# Patient Record
Sex: Male | Born: 1980 | Race: White | Hispanic: No | Smoking: Current every day smoker
Health system: Southern US, Community
[De-identification: ages and names within clinical notes are randomized; demographics above are authoritative.]

## PROBLEM LIST (undated history)

## (undated) DIAGNOSIS — I10 Essential (primary) hypertension: Secondary | ICD-10-CM

## (undated) DIAGNOSIS — K759 Inflammatory liver disease, unspecified: Secondary | ICD-10-CM

---

## 2018-10-26 DIAGNOSIS — F332 Major depressive disorder, recurrent severe without psychotic features: Secondary | ICD-10-CM | POA: Insufficient documentation

## 2018-10-26 DIAGNOSIS — Z20828 Contact with and (suspected) exposure to other viral communicable diseases: Secondary | ICD-10-CM | POA: Insufficient documentation

## 2018-10-26 DIAGNOSIS — R45851 Suicidal ideations: Secondary | ICD-10-CM | POA: Diagnosis not present

## 2018-10-27 ENCOUNTER — Encounter (HOSPITAL_COMMUNITY): Payer: Self-pay

## 2018-10-27 ENCOUNTER — Ambulatory Visit (HOSPITAL_COMMUNITY): Payer: Self-pay

## 2018-10-27 ENCOUNTER — Observation Stay (HOSPITAL_COMMUNITY)
Admission: AD | Admit: 2018-10-27 | Discharge: 2018-10-28 | Disposition: A | Discharge status: Home / Self Care | Payer: No Typology Code available for payment source | Source: Intra-hospital | Attending: Psychiatry | Admitting: Psychiatry

## 2018-10-27 ENCOUNTER — Other Ambulatory Visit: Payer: Self-pay

## 2018-10-27 ENCOUNTER — Emergency Department (HOSPITAL_COMMUNITY)
Admission: EM | Admit: 2018-10-27 | Discharge: 2018-10-27 | Disposition: A | Discharge status: Other Acute Inpatient Hospital | Payer: No Typology Code available for payment source | Attending: Emergency Medicine | Admitting: Emergency Medicine

## 2018-10-27 ENCOUNTER — Encounter (HOSPITAL_COMMUNITY): Payer: Self-pay | Admitting: *Deleted

## 2018-10-27 DIAGNOSIS — F4321 Adjustment disorder with depressed mood: Secondary | ICD-10-CM | POA: Diagnosis not present

## 2018-10-27 DIAGNOSIS — R45851 Suicidal ideations: Secondary | ICD-10-CM | POA: Diagnosis present

## 2018-10-27 DIAGNOSIS — Z79899 Other long term (current) drug therapy: Secondary | ICD-10-CM | POA: Diagnosis not present

## 2018-10-27 DIAGNOSIS — F332 Major depressive disorder, recurrent severe without psychotic features: Secondary | ICD-10-CM | POA: Diagnosis present

## 2018-10-27 DIAGNOSIS — F1114 Opioid abuse with opioid-induced mood disorder: Secondary | ICD-10-CM

## 2018-10-27 DIAGNOSIS — F319 Bipolar disorder, unspecified: Secondary | ICD-10-CM | POA: Insufficient documentation

## 2018-10-27 DIAGNOSIS — Z20828 Contact with and (suspected) exposure to other viral communicable diseases: Secondary | ICD-10-CM | POA: Insufficient documentation

## 2018-10-27 DIAGNOSIS — I10 Essential (primary) hypertension: Secondary | ICD-10-CM | POA: Insufficient documentation

## 2018-10-27 DIAGNOSIS — Z791 Long term (current) use of non-steroidal anti-inflammatories (NSAID): Secondary | ICD-10-CM | POA: Diagnosis not present

## 2018-10-27 DIAGNOSIS — F1119 Opioid abuse with unspecified opioid-induced disorder: Secondary | ICD-10-CM | POA: Diagnosis not present

## 2018-10-27 HISTORY — DX: Inflammatory liver disease, unspecified: K75.9

## 2018-10-27 HISTORY — DX: Essential (primary) hypertension: I10

## 2018-10-27 LAB — CBC
HCT: 45.8 % (ref 39.0–52.0)
Hemoglobin: 15.5 g/dL (ref 13.0–17.0)
MCH: 30.6 pg (ref 26.0–34.0)
MCHC: 33.8 g/dL (ref 30.0–36.0)
MCV: 90.3 fL (ref 80.0–100.0)
Platelets: 228 10*3/uL (ref 150–400)
RBC: 5.07 MIL/uL (ref 4.22–5.81)
RDW: 12.6 % (ref 11.5–15.5)
WBC: 6.9 10*3/uL (ref 4.0–10.5)
nRBC: 0 % (ref 0.0–0.2)

## 2018-10-27 LAB — SARS CORONAVIRUS 2 BY RT PCR (HOSPITAL ORDER, PERFORMED IN ~~LOC~~ HOSPITAL LAB): SARS Coronavirus 2: NEGATIVE

## 2018-10-27 LAB — COMPREHENSIVE METABOLIC PANEL
ALT: 25 U/L (ref 0–44)
AST: 21 U/L (ref 15–41)
Albumin: 4.3 g/dL (ref 3.5–5.0)
Alkaline Phosphatase: 49 U/L (ref 38–126)
Anion gap: 10 (ref 5–15)
BUN: 15 mg/dL (ref 6–20)
CO2: 23 mmol/L (ref 22–32)
Calcium: 9.4 mg/dL (ref 8.9–10.3)
Chloride: 102 mmol/L (ref 98–111)
Creatinine, Ser: 1.01 mg/dL (ref 0.61–1.24)
GFR calc Af Amer: >60 mL/min (ref >60)
GFR calc non Af Amer: >60 mL/min (ref >60)
Glucose, Bld: 103 mg/dL — ABNORMAL HIGH (ref 70–99)
Potassium: 3.9 mmol/L (ref 3.5–5.1)
Sodium: 135 mmol/L (ref 135–145)
Total Bilirubin: 0.9 mg/dL (ref 0.3–1.2)
Total Protein: 7.6 g/dL (ref 6.5–8.1)

## 2018-10-27 LAB — RAPID URINE DRUG SCREEN, HOSP PERFORMED
Amphetamines: NOT DETECTED
Barbiturates: NOT DETECTED
Benzodiazepines: NOT DETECTED
Cocaine: NOT DETECTED
Opiates: NOT DETECTED
Tetrahydrocannabinol: NOT DETECTED

## 2018-10-27 LAB — ETHANOL: Alcohol, Ethyl (B): <10 mg/dL (ref <10)

## 2018-10-27 LAB — ACETAMINOPHEN LEVEL: Acetaminophen (Tylenol), Serum: <10 ug/mL — ABNORMAL LOW (ref 10–30)

## 2018-10-27 LAB — SALICYLATE LEVEL: Salicylate Lvl: <7 mg/dL (ref 2.8–30.0)

## 2018-10-27 MED ORDER — QUETIAPINE FUMARATE 300 MG PO TABS: 300.0000 mg | ORAL_TABLET | Freq: Every day | ORAL | Status: DC

## 2018-10-27 MED ORDER — MAGNESIUM HYDROXIDE 400 MG/5ML PO SUSP: 30.0000 mL | Freq: Every day | ORAL | Status: DC | PRN

## 2018-10-27 MED ORDER — LORAZEPAM 1 MG PO TABS
1.0000 mg | ORAL_TABLET | ORAL | Status: DC | PRN
  Administered 2018-10-27: 1 mg via ORAL
  Filled 2018-10-27: qty 1

## 2018-10-27 MED ORDER — HYDROXYZINE HCL 25 MG PO TABS
25.0000 mg | ORAL_TABLET | Freq: Three times a day (TID) | ORAL | Status: DC | PRN
  Administered 2018-10-27: 25 mg via ORAL
  Filled 2018-10-27: qty 1

## 2018-10-27 MED ORDER — FLUOXETINE HCL 10 MG PO CAPS
10.0000 mg | ORAL_CAPSULE | Freq: Every day | ORAL | Status: DC
  Administered 2018-10-27 – 2018-10-28 (×2): 10 mg via ORAL
  Filled 2018-10-27 (×2): qty 1

## 2018-10-27 MED ORDER — FAMOTIDINE 10 MG PO TABS
20.0000 mg | ORAL_TABLET | Freq: Once | ORAL | Status: AC
  Administered 2018-10-27: 22:00:00 20 mg via ORAL
  Filled 2018-10-27: qty 2

## 2018-10-27 MED ORDER — NICOTINE 21 MG/24HR TD PT24
21.0000 mg | MEDICATED_PATCH | Freq: Every day | TRANSDERMAL | Status: DC
  Administered 2018-10-27: 21 mg via TRANSDERMAL
  Filled 2018-10-27 (×2): qty 1

## 2018-10-27 MED ORDER — ACETAMINOPHEN 325 MG PO TABS: 650.0000 mg | ORAL_TABLET | Freq: Four times a day (QID) | ORAL | Status: DC | PRN

## 2018-10-27 MED ORDER — QUETIAPINE FUMARATE 300 MG PO TABS
300.0000 mg | ORAL_TABLET | Freq: Every day | ORAL | Status: DC
  Administered 2018-10-27: 300 mg via ORAL
  Filled 2018-10-27: qty 1

## 2018-10-27 MED ORDER — ALUM & MAG HYDROXIDE-SIMETH 200-200-20 MG/5ML PO SUSP
30.0000 mL | ORAL | Status: DC | PRN
  Administered 2018-10-27: 30 mL via ORAL
  Filled 2018-10-27: qty 30

## 2018-10-27 MED ORDER — QUETIAPINE FUMARATE 200 MG PO TABS: 200.0000 mg | ORAL_TABLET | Freq: Every day | ORAL | Status: DC

## 2018-10-27 NOTE — ED Triage Notes (Signed)
Pt reports depression and SI. He states that the last time he was at the hospital they gave him seroquel for sleep, but nothing for depression. He reports that he has a plan to kill himself, but did not divulge. A&Ox4. Tearful in triage.
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
## 2018-10-27 NOTE — Plan of Care (Signed)
   Reason for Crisis Plan:  Crisis Stabilization   Plan of Care:  Referral for Telepsychiatry/Psychiatric Consult  Family Support:      Current Living Environment:  Living Arrangements: Alone  Insurance:   Hospital Account    Name Acct ID Class Status Primary Coverage   Jonathandavid, [place] [id number] [place] [employer] MEDICAID - [facility]        Guarantor Account (for Hospital Account [id number])    Name Relation to Pt Service Area Active? Acct Type   Wyn Quaker Self [facility] Yes Behavioral Health   Address Phone       [address] [place], [place] [address] [phone](H)          Coverage Information (for Hospital Account [id number])    F/O Payor/Plan Precert #   [facility] MEDICAID/[facility]    Subscriber Subscriber #   [place], [place] [id number]   Address Phone   [place], [place] [address] [phone]      Legal Guardian:     Primary Care Provider:  Patient, No Pcp Per  Current Outpatient Providers:   Psychiatrist:     Counselor/Therapist:     Compliant with Medications:   Additional Information:   Mosie Lukes 9/5/20203:03 PM

## 2018-10-27 NOTE — ED Provider Notes (Signed)
[facility] DEPT Provider Note   CSN: [id number] Arrival date & time: 10/26/18  2341     History   Chief Complaint Chief Complaint  Patient presents with  . Suicidal    HPI Robert Bass is a 38 y.o. male.     The history is provided by the patient.  Mental Health Problem Presenting symptoms: suicidal thoughts   Degree of incapacity (severity):  Moderate Onset quality:  Sudden Timing:  Constant Progression:  Worsening Chronicity:  Recurrent Relieved by:  Nothing Worsened by:  Nothing Associated symptoms: no abdominal pain, no chest pain and no headaches   Patient presents for suicidal ideation.  He reports recent admission for depression, but reports he is not on meds anymore.  He reports having thoughts of harming self.  Denies any attempt.  He has no other complaints   PMH-Depression Soc hx - unknown Home Medications    Prior to Admission medications   Not on File    Family History History reviewed. No pertinent family history.  Social History Social History   Tobacco Use  . Smoking status: Not on file  Substance Use Topics  . Alcohol use: Not on file  . Drug use: Not on file     Allergies   Patient has no known allergies.   Review of Systems Review of Systems  Constitutional: Negative for fever.  Cardiovascular: Negative for chest pain.  Gastrointestinal: Negative for abdominal pain.  Neurological: Negative for headaches.  Psychiatric/Behavioral: Positive for suicidal ideas.  All other systems reviewed and are negative.    Physical Exam Updated Vital Signs BP 139/86 (BP Location: Left Arm)   Pulse 88   Temp 98 F (36.7 C) (Oral)   Resp 16   SpO2 99%   Physical Exam CONSTITUTIONAL: Disheveled, no acute distress HEAD: Normocephalic/atraumatic EYES: EOMI ENMT: Mucous membranes moist NECK: supple no meningeal signs SPINE/BACK:entire spine nontender CV: S1/S2 noted, no murmurs/rubs/gallops noted LUNGS:  Lungs are clear to auscultation bilaterally, no apparent distress ABDOMEN: soft, nontender NEURO: Pt is awake/alert/appropriate, moves all extremitiesx4.  No facial droop.   EXTREMITIES: pulses normal/equal, full ROM SKIN: warm, color normal PSYCH: no abnormalities of mood noted, alert and oriented to situation   ED Treatments / Results  Labs (all labs ordered are listed, but only abnormal results are displayed) Labs Reviewed  COMPREHENSIVE METABOLIC PANEL - Abnormal; Notable for the following components:      Result Value   Glucose, Bld 103 (*)    All other components within normal limits  ACETAMINOPHEN LEVEL - Abnormal; Notable for the following components:   Acetaminophen (Tylenol), Serum <10 (*)    All other components within normal limits  SARS CORONAVIRUS 2 (HOSPITAL ORDER, [facility])  ETHANOL  SALICYLATE LEVEL  CBC  RAPID URINE DRUG SCREEN, HOSP PERFORMED    EKG None  Radiology No results found.  Procedures Procedures   Medications Ordered in ED Medications  LORazepam (ATIVAN) tablet 1 mg (1 mg Oral Given 10/27/18 0240)  QUEtiapine (SEROQUEL) tablet 300 mg (has no administration in time range)     Initial Impression / Assessment and Plan / ED Course  I have reviewed the triage vital signs and the nursing notes.  Pertinent labs  results that were available during my care of the patient were reviewed by me and considered in my medical decision making (see chart for details).        1:47 AM Patient is medically stable and appropriate for  psychiatric disposition  Final Clinical Impressions(s) / ED Diagnoses   Final diagnoses:  Suicidal ideation    ED Discharge Orders    None       Keric Zehren, DoZadie Rhinenald, MD 10/27/18 [phone]

## 2018-10-27 NOTE — Progress Notes (Signed)
Patient ID: Robert Bass, male   DOB: 26-Jun-1980, 38 y.o.   MRN: [id number] Pt A&O x 4, no distress noted, calm & cooperative, pt taking a shower at present.  Monitoring for safety.
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
## 2018-10-27 NOTE — ED Notes (Signed)
Provider at bedside

## 2018-10-27 NOTE — Patient Outreach (Signed)
CPSS met with the patient on the [facility] observation unit in order to provide substance use recovery support and help with getting connected to substance use treatment resources. Patient states that he has not actively used any drugs in about 2 weeks. Patient reports a past history of heroin and methamphetamine use. Patient is interested in dual diagnosis resources specifically for the [place] area. CPSS provided information for several different outpatient treatment centers that provided dual diagnosis treatment in the [place] area. CPSS also provided an [facility] NA meeting list and CPSS contact information. CPSS strongly encouraged the patient to contact CPSS if needed for further help with getting connected to substance use treatment resources.

## 2018-10-27 NOTE — BH Assessment (Signed)
Tele Assessment Note   Patient Name: Robert [facility] MRN: [id number] Referring Physician: Zadie Rhine, MD Location of Patient: [facility], [phone] Location of Provider: Behavioral Health TTS Department  [name] is an 38 y.o. single male who presents unaccompanied to [facility] reporting depressive symptoms including suicidal ideation. Pt is a poor historian because he is very sleepy and appears groggy. Pt reports he has a history of depression and has felt severely depressed for the past week. He says he is very upset because his in-laws have custody of his 47 year old son and will not let Pt see him. He reports having thoughts of hurting in-laws with no plan or intent. Pt reports current suicidal ideation with plan to overdose on pills. He denies previous suicide attempts. He reports his father died by suicide. Pt acknowledges symptoms including crying spells, social withdrawal, loss of interest in usual pleasures, fatigue, irritability, decreased concentration, decreased sleep and feelings of worthlessness and hopelessness. He denies intentional self-injurious behavior. He denies history of aggressive behavior. He denies any history of auditory or visual hallucinations. Pt report he has a history of using drugs, including methamphetamines, but denies recent use. Pt's urine drugs and alcohol screens are negative.  Pt identifies inability to see son as his primary stressor. He says he lives alone. He reports a history of being convicted of manufacturing methamphetamines. He cannot identify and social supports. Pt says he was psychiatrically hospitalized approximately one month ago at a facility in [place], [place]. He says he was discharged with Seroquel for sleep but no antidepressants.   Pt is dressed in hospital scrubs bottoms and no shirt. He is very drowsy and oriented to person, place and situation. Pt speaks in a mumbled tone, at low volume and normal pace. Motor behavior appears normal.  Eye contact is minimal. Pt's mood is depressed and affect is congruent with mood. Thought process is coherent and relevant. There is no indication Pt is currently responding to internal stimuli or experiencing delusional thought content. Pt was cooperative throughout assessment. He says he is willing to sign voluntarily into a psychiatric facility.   Diagnosis: F33.2 Major depressive disorder, Recurrent episode, Severe  Past Medical History: History reviewed. No pertinent past medical history.    Family History: History reviewed. No pertinent family history.  Social History:  has no history on file for tobacco, alcohol, and drug.  Additional Social History:  Alcohol / Drug Use Pain Medications: Denies abuse Prescriptions: Denies abuse Over the Counter: Denies abuse History of alcohol / drug use?: Yes(Pt reports history of substance use. Denies recent use.) Longest period of sobriety (when/how long): Unknown  CIWA: CIWA-Ar BP: 139/86 Pulse Rate: 88 COWS:    Allergies: No Known Allergies  Home Medications: (Not in a hospital admission)   OB/GYN Status:  No LMP for male patient.  General Assessment Data Location of Assessment: [facility] TTS Assessment: In system Is this a Tele or Face-to-Face Assessment?: Tele Assessment Is this an Initial Assessment or a Re-assessment for this encounter?: Initial Assessment Patient Accompanied by:: N/A Language Other than English: No Living Arrangements: Other (Comment)(Lives alone) What gender do you identify as?: Male Marital status: Single Maiden name: NA Pregnancy Status: No Living Arrangements: Alone Can pt return to current living arrangement?: Yes Admission Status: Voluntary Is patient capable of signing voluntary admission?: Yes Referral Source: Self/Family/Friend Insurance type: Medicaid     Crisis Care Plan Living Arrangements: Alone Legal Guardian: Other:(Self) Name of Psychiatrist: None Name of Therapist:  None  Education Status Is patient currently in school?: No Is the patient employed, unemployed or receiving disability?: Unemployed  Risk to self with the past 6 months Suicidal Ideation: Yes-Currently Present Has patient been a risk to self within the past 6 months prior to admission? : Yes Suicidal Intent: Yes-Currently Present Has patient had any suicidal intent within the past 6 months prior to admission? : Yes Is patient at risk for suicide?: Yes Suicidal Plan?: Yes-Currently Present Has patient had any suicidal plan within the past 6 months prior to admission? : Yes Specify Current Suicidal Plan: Overdose on pills Access to Means: Yes Specify Access to Suicidal Means: Access to medications What has been your use of drugs/alcohol within the last 12 months?: Pt reports a history of substance use. Denies recent use Previous Attempts/Gestures: No How many times?: 0 Other Self Harm Risks: None Triggers for Past Attempts: None known Intentional Self Injurious Behavior: None Family Suicide History: Yes(Father died by suicide) Recent stressful life event(s): Legal Issues, Other (Comment)(Inlaws have custody of Pt's son) Persecutory voices/beliefs?: No Depression: Yes Depression Symptoms: Despondent, Tearfulness, Isolating, Feeling angry/irritable, Feeling worthless/self pity, Loss of interest in usual pleasures, Fatigue, Guilt Substance abuse history and/or treatment for substance abuse?: Yes Suicide prevention information given to non-admitted patients: Not applicable  Risk to Others within the past 6 months Homicidal Ideation: Yes-Currently Present Does patient have any lifetime risk of violence toward others beyond the six months prior to admission? : No Thoughts of Harm to Others: Yes-Currently Present Comment - Thoughts of Harm to Others: Thoughts of harming inlaws Current Homicidal Intent: No Current Homicidal Plan: No Access to Homicidal Means: No Identified Victim:  Inlaws History of harm to others?: No Assessment of Violence: None Noted Violent Behavior Description: Pt denies history of violence Does patient have access to weapons?: No Criminal Charges Pending?: No Does patient have a court date: No Court Date: (Unknown) Is patient on probation?: Unknown  Psychosis Hallucinations: None noted Delusions: None noted  Mental Status Report Appearance/Hygiene: In scrubs Eye Contact: Poor Motor Activity: Unremarkable, Freedom of movement Speech: Soft Level of Consciousness: Drowsy Mood: Depressed Affect: Depressed Anxiety Level: None Thought Processes: Coherent, Relevant Judgement: Partial Orientation: Person, Place, Time, Situation Obsessive Compulsive Thoughts/Behaviors: None  Cognitive Functioning Concentration: Decreased Memory: Recent Intact, Remote Intact Is patient IDD: No Insight: Fair Impulse Control: Fair Appetite: Good Have you had any weight changes? : No Change Sleep: Decreased Total Hours of Sleep: 3 Vegetative Symptoms: None  ADLScreening [facility] Assessment Services) Patient's cognitive ability adequate to safely complete daily activities?: Yes Patient able to express need for assistance with ADLs?: Yes Independently performs ADLs?: Yes (appropriate for developmental age)  Prior Inpatient Therapy Prior Inpatient Therapy: Yes Prior Therapy Dates: 09/2018 Prior Therapy Facilty/Provider(s): [facility] Reason for Treatment: Depression  Prior Outpatient Therapy Prior Outpatient Therapy: No Does patient have an ACCT team?: No Does patient have Intensive In-House Services?  : No Does patient have [facility] services? : No Does patient have P4CC services?: No  ADL Screening (condition at time of admission) Patient's cognitive ability adequate to safely complete daily activities?: Yes Is the patient deaf or have difficulty hearing?: No Does the patient have difficulty seeing, even when wearing glasses/contacts?:  No Does the patient have difficulty concentrating, remembering, or making decisions?: No Patient able to express need for assistance with ADLs?: Yes Does the patient have difficulty dressing or bathing?: No Independently performs ADLs?: Yes (appropriate for developmental age) Does the patient have difficulty walking or climbing stairs?:  No Weakness of Legs: None Weakness of Arms/Hands: None  Home Assistive Devices/Equipment Home Assistive Devices/Equipment: None    Abuse/Neglect Assessment (Assessment to be complete while patient is alone) Abuse/Neglect Assessment Can Be Completed: Yes Physical Abuse: Denies Verbal Abuse: Denies Sexual Abuse: Denies Exploitation of patient/patient's resources: Denies Self-Neglect: Denies     Merchant navy officerAdvance Directives (For Healthcare) Does Patient Have a Medical Advance Directive?: No Would patient like information on creating a medical advance directive?: No - Patient declined          Disposition: Gave clinical report to Nira ConnJason Berry, FNP who recommended Pt be admitted to Observation Unit for further evaluation. Binnie RailJoAnn Glover, [facility] confirmed bed [facility]. Pt accepted to room 206-1 pending resulted COVID test. Notified Dr. Zadie Rhineonald Wickline and Clide CliffJacob Talkington, RN of acceptance.  Disposition Initial Assessment Completed for this Encounter: Yes  This service was provided via telemedicine using a 2-way, interactive audio and video technology.  Names of all persons participating in this telemedicine service and their role in this encounter. Name: Donnella ShamGary Skaggs Role: Patient  Name: Shela CommonsFord Kymberly Blomberg Jr, [facility] Role: TTS counselor         Harlin RainFord Ellis Patsy BaltimoreWarrick Jr, [facility], [facility], [facility] Triage Specialist [phone]  Pamalee LeydenWarrick Jr, Minard Millirons Ellis 10/27/2018 5:55 AM

## 2018-10-28 ENCOUNTER — Encounter (HOSPITAL_COMMUNITY): Payer: Self-pay

## 2018-10-28 ENCOUNTER — Emergency Department (HOSPITAL_COMMUNITY)
Admission: EM | Admit: 2018-10-28 | Discharge: 2018-10-30 | Payer: No Typology Code available for payment source | Attending: Emergency Medicine | Admitting: Emergency Medicine

## 2018-10-28 ENCOUNTER — Other Ambulatory Visit: Payer: Self-pay

## 2018-10-28 DIAGNOSIS — R45851 Suicidal ideations: Secondary | ICD-10-CM | POA: Diagnosis not present

## 2018-10-28 DIAGNOSIS — T1491XA Suicide attempt, initial encounter: Secondary | ICD-10-CM | POA: Diagnosis present

## 2018-10-28 DIAGNOSIS — F17210 Nicotine dependence, cigarettes, uncomplicated: Secondary | ICD-10-CM | POA: Insufficient documentation

## 2018-10-28 DIAGNOSIS — F332 Major depressive disorder, recurrent severe without psychotic features: Secondary | ICD-10-CM | POA: Insufficient documentation

## 2018-10-28 DIAGNOSIS — R4702 Dysphasia: Secondary | ICD-10-CM | POA: Diagnosis not present

## 2018-10-28 DIAGNOSIS — F1119 Opioid abuse with unspecified opioid-induced disorder: Secondary | ICD-10-CM | POA: Diagnosis not present

## 2018-10-28 DIAGNOSIS — T43592A Poisoning by other antipsychotics and neuroleptics, intentional self-harm, initial encounter: Secondary | ICD-10-CM | POA: Diagnosis not present

## 2018-10-28 DIAGNOSIS — F1114 Opioid abuse with opioid-induced mood disorder: Secondary | ICD-10-CM | POA: Diagnosis present

## 2018-10-28 DIAGNOSIS — Z20828 Contact with and (suspected) exposure to other viral communicable diseases: Secondary | ICD-10-CM | POA: Insufficient documentation

## 2018-10-28 LAB — BASIC METABOLIC PANEL
Anion gap: 12 (ref 5–15)
BUN: 13 mg/dL (ref 6–20)
CO2: 24 mmol/L (ref 22–32)
Calcium: 9.2 mg/dL (ref 8.9–10.3)
Chloride: 103 mmol/L (ref 98–111)
Creatinine, Ser: 1 mg/dL (ref 0.61–1.24)
GFR calc Af Amer: >60 mL/min (ref >60)
GFR calc non Af Amer: >60 mL/min (ref >60)
Glucose, Bld: 117 mg/dL — ABNORMAL HIGH (ref 70–99)
Potassium: 3.4 mmol/L — ABNORMAL LOW (ref 3.5–5.1)
Sodium: 139 mmol/L (ref 135–145)

## 2018-10-28 LAB — COMPREHENSIVE METABOLIC PANEL
ALT: 27 U/L (ref 0–44)
AST: 25 U/L (ref 15–41)
Albumin: 4.8 g/dL (ref 3.5–5.0)
Alkaline Phosphatase: 49 U/L (ref 38–126)
Anion gap: 10 (ref 5–15)
BUN: 13 mg/dL (ref 6–20)
CO2: 28 mmol/L (ref 22–32)
Calcium: 9.8 mg/dL (ref 8.9–10.3)
Chloride: 103 mmol/L (ref 98–111)
Creatinine, Ser: 1.08 mg/dL (ref 0.61–1.24)
GFR calc Af Amer: >60 mL/min (ref >60)
GFR calc non Af Amer: >60 mL/min (ref >60)
Glucose, Bld: 96 mg/dL (ref 70–99)
Potassium: 3.8 mmol/L (ref 3.5–5.1)
Sodium: 141 mmol/L (ref 135–145)
Total Bilirubin: 0.9 mg/dL (ref 0.3–1.2)
Total Protein: 8.1 g/dL (ref 6.5–8.1)

## 2018-10-28 LAB — CBC WITH DIFFERENTIAL/PLATELET
Abs Immature Granulocytes: 0.03 10*3/uL (ref 0.00–0.07)
Basophils Absolute: 0 10*3/uL (ref 0.0–0.1)
Basophils Relative: 1 %
Eosinophils Absolute: 0 10*3/uL (ref 0.0–0.5)
Eosinophils Relative: 0 %
HCT: 42 % (ref 39.0–52.0)
Hemoglobin: 14.4 g/dL (ref 13.0–17.0)
Immature Granulocytes: 0 %
Lymphocytes Relative: 15 %
Lymphs Abs: 1.3 10*3/uL (ref 0.7–4.0)
MCH: 30.7 pg (ref 26.0–34.0)
MCHC: 34.3 g/dL (ref 30.0–36.0)
MCV: 89.6 fL (ref 80.0–100.0)
Monocytes Absolute: 0.7 10*3/uL (ref 0.1–1.0)
Monocytes Relative: 8 %
Neutro Abs: 6.6 10*3/uL (ref 1.7–7.7)
Neutrophils Relative %: 76 %
Platelets: 195 10*3/uL (ref 150–400)
RBC: 4.69 MIL/uL (ref 4.22–5.81)
RDW: 12.5 % (ref 11.5–15.5)
WBC: 8.7 10*3/uL (ref 4.0–10.5)
nRBC: 0 % (ref 0.0–0.2)

## 2018-10-28 LAB — CBC
HCT: 47.1 % (ref 39.0–52.0)
Hemoglobin: 15.6 g/dL (ref 13.0–17.0)
MCH: 30.1 pg (ref 26.0–34.0)
MCHC: 33.1 g/dL (ref 30.0–36.0)
MCV: 90.8 fL (ref 80.0–100.0)
Platelets: 226 10*3/uL (ref 150–400)
RBC: 5.19 MIL/uL (ref 4.22–5.81)
RDW: 12.4 % (ref 11.5–15.5)
WBC: 10.4 10*3/uL (ref 4.0–10.5)
nRBC: 0 % (ref 0.0–0.2)

## 2018-10-28 LAB — RAPID URINE DRUG SCREEN, HOSP PERFORMED
Amphetamines: NOT DETECTED
Amphetamines: NOT DETECTED
Barbiturates: NOT DETECTED
Barbiturates: NOT DETECTED
Benzodiazepines: NOT DETECTED
Benzodiazepines: NOT DETECTED
Cocaine: NOT DETECTED
Cocaine: NOT DETECTED
Opiates: NOT DETECTED
Opiates: NOT DETECTED
Tetrahydrocannabinol: NOT DETECTED
Tetrahydrocannabinol: NOT DETECTED

## 2018-10-28 LAB — ETHANOL: Alcohol, Ethyl (B): <10 mg/dL (ref <10)

## 2018-10-28 LAB — ACETAMINOPHEN LEVEL: Acetaminophen (Tylenol), Serum: <10 ug/mL — ABNORMAL LOW (ref 10–30)

## 2018-10-28 LAB — SALICYLATE LEVEL
Salicylate Lvl: <7 mg/dL (ref 2.8–30.0)
Salicylate Lvl: <7 mg/dL (ref 2.8–30.0)

## 2018-10-28 LAB — LITHIUM LEVEL: Lithium Lvl: <0.06 mmol/L — ABNORMAL LOW (ref 0.60–1.20)

## 2018-10-28 LAB — SARS CORONAVIRUS 2 BY RT PCR (HOSPITAL ORDER, PERFORMED IN ~~LOC~~ HOSPITAL LAB): SARS Coronavirus 2: NEGATIVE

## 2018-10-28 MED ORDER — FLUOXETINE HCL 10 MG PO CAPS: 10.0000 mg | ORAL_CAPSULE | Freq: Every day | ORAL | 0 refills | Status: AC

## 2018-10-28 MED ORDER — HYDROXYZINE HCL 25 MG PO TABS: 25.0000 mg | ORAL_TABLET | Freq: Three times a day (TID) | ORAL | 0 refills | Status: AC | PRN

## 2018-10-28 MED ORDER — SODIUM CHLORIDE 0.9 % IV BOLUS
1000.0000 mL | Freq: Once | INTRAVENOUS | Status: AC
  Administered 2018-10-28: 1000 mL via INTRAVENOUS

## 2018-10-28 NOTE — ED Provider Notes (Addendum)
[facility]-EMERGENCY DEPT Provider Note   CSN: [id number] Arrival date & time: 10/28/18  1753     History   Chief Complaint Chief Complaint  Patient presents with   Suicidal    HPI Robert Bass is a 38 y.o. male.     The history is provided by the patient.  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 No pertinent surgical history.      Home Medications    Prior to Admission medications   Medication Sig Start Date End Date Taking? Authorizing Provider  FLUoxetine (PROZAC) 10 MG capsule Take 1 capsule (10 mg total) by mouth daily. 10/29/18   Starkes-Perry, Juel Burrowakia S, FNP  hydrOXYzine (ATARAX/VISTARIL) 25 MG tablet Take 1 tablet (25 mg total) by mouth 3 (three) times daily as needed for anxiety. 10/28/18   Starkes-Perry, Juel Burrowakia S, FNP  QUEtiapine (SEROQUEL XR) 300 MG 24 hr tablet Take 300 mg by mouth at bedtime.    [provider]    Family History No family history on file.  Social History Social History   Tobacco Use   Smoking status: Current Every Day Smoker    Packs/day: 0.50    Types: Cigarettes   Smokeless tobacco: Never Used  Substance Use Topics   Alcohol use: Yes   Drug use: Yes    Types: Methamphetamines, Heroin     Allergies     Patient has no known allergies.   Review of Systems Review of Systems  Constitutional: Negative for chills and fever.  HENT: Negative for ear pain and sore throat.   Eyes: Negative for pain and visual disturbance.  Respiratory: Negative for cough and shortness of breath.   Cardiovascular: Negative for chest pain and palpitations.  Gastrointestinal: Negative for abdominal pain and vomiting.  Genitourinary: Negative for dysuria and hematuria.  Musculoskeletal: Negative for arthralgias and back pain.  Skin: Negative for color change and rash.  Neurological: Negative for seizures and syncope.  Psychiatric/Behavioral: Positive for suicidal ideas.  All other systems reviewed and are negative.    Physical Exam Updated Vital Signs  ED Triage Vitals  Enc Vitals Group     BP 10/28/18 1803 (!) 160/104     Pulse Rate 10/28/18 1803 80     Resp 10/28/18 1803 16     Temp 10/28/18 1803 98 F (36.7 C)     Temp src --      SpO2 10/28/18 1803 100 %     Weight 10/28/18 1800 130 lb (59 kg)     Height 10/28/18 1800 5\' 6"  (1.676 m)     Head Circumference --      Peak Flow --      Pain Score 10/28/18 1800 4     Pain Loc --      Pain Edu? --  Excl. in [place]? --     Physical Exam Vitals signs and nursing note reviewed.  Constitutional:      General: He is not in acute distress.    Appearance: He is well-developed. He is not ill-appearing.  HENT:     Head: Normocephalic and atraumatic.     Nose: Nose normal.     Mouth/Throat:     Mouth: Mucous membranes are moist.  Eyes:     Extraocular Movements: Extraocular movements intact.     Conjunctiva/sclera: Conjunctivae normal.     Pupils: Pupils are equal, round, and reactive to light.  Neck:     Musculoskeletal: Normal range of motion and neck supple.  Cardiovascular:     Rate and Rhythm: Normal rate and regular rhythm.     Pulses: Normal pulses.     Heart sounds: Normal heart sounds. No murmur.  Pulmonary:     Effort: Pulmonary  effort is normal. No respiratory distress.     Breath sounds: Normal breath sounds.  Abdominal:     General: There is no distension.     Palpations: Abdomen is soft.     Tenderness: There is no abdominal tenderness.  Skin:    General: Skin is warm and dry.     Capillary Refill: Capillary refill takes less than 2 seconds.  Neurological:     General: No focal deficit present.     Mental Status: He is alert.  Psychiatric:        Mood and Affect: Mood is anxious.        Thought Content: Thought content includes suicidal ideation. Thought content does not include homicidal ideation. Thought content does not include homicidal plan.        Judgment: Judgment is impulsive.      ED Treatments / Results  Labs (all labs ordered are listed, but only abnormal results are displayed) Labs Reviewed  ACETAMINOPHEN LEVEL - Abnormal; Notable for the following components:      Result Value   Acetaminophen (Tylenol), Serum <10 (*)    All other components within normal limits  COMPREHENSIVE METABOLIC PANEL  ETHANOL  SALICYLATE LEVEL  CBC  RAPID URINE DRUG SCREEN, HOSP PERFORMED    EKG None  Radiology No results found.  Procedures .Critical Care Performed by: Lennice Sites, DO Authorized by: Lennice Sites, DO   Critical care provider statement:    Critical care time (minutes):  65   Critical care was necessary to treat or prevent imminent or life-threatening deterioration of the following conditions:  CNS failure or compromise and toxidrome   Critical care was time spent personally by me on the following activities:  Blood draw for specimens, development of treatment plan with patient or surrogate, discussions with consultants, evaluation of patient's response to treatment, examination of patient, obtaining history from patient or surrogate, ordering and performing treatments and interventions, ordering and review of laboratory studies, ordering and review of radiographic studies, pulse  oximetry, re-evaluation of patient's condition and review of old charts   I assumed direction of critical care for this patient from another provider in my specialty: no     (including critical care time)  Medications Ordered in ED Medications - No data to display   Initial Impression / Assessment and Plan / ED Course  I have reviewed the triage vital signs and the nursing notes.  Pertinent labs & imaging results that were available during my care of the patient were reviewed by me and considered in my medical decision making (  see chart for details).        Robert Bass is a 38 year old male with history of depression, substance abuse who presents to the ED with suicidal ideation with plan to overdose on medications.  He was just discharged from behavioral health recently.  He states that medications are not helping.  He was [facility] to [facility] to [facility].  Patient seeking mental health care.  States that he is suicidal, he is tearful.  Denies any recent drug or alcohol abuse.  Will get medical screening labs and have behavioral health discuss options with him.  Not sure if most of his visit today is for homelessness but it appears that he did not meet criteria for admission at [facility] previously.  Awaiting psychiatric disposition.  Medical clearance being performed.  Labs unremarkable.  Patient medically cleared.  Awaiting psychiatric consultation.  Will be admitted to outpatient psych facility.   While patient was awaiting transfer to psychiatric facility and awaiting to be placed into psych hold it appears that he likely ingested something.  He was found tachycardic.  Patient moves all extremities, opens eyes spontaneously.  However at this time seen that he has possibly overdosed on something.  Will need further medical clearance now at this time.  Patient had not been placed into gown and his materials from home have not been removed from him.  In his back he had an empty  bottle of Seroquel that had 30 pills filled 1 week ago.  He had a bottle of over-the-counter lithium supplement with 45 pills missing.  His medical list showed that he had an active prescription for both Prozac and Vistaril.  He does have some pinpoint pupils but has normal respirations.  Patient is tachycardic.  Overall has a complicated toxidrome.  Talked with poison control extensively on the phone.  They state that lithium over-the-counter typically does not cause any issues.  Lithium level was drawn and was normal.  Will repeat at the same time we check a 4-hour Tylenol level.  Patient already had blood work drawn prior to this event that was normal.  Repeat blood work showed normal kidney function.  Normal drug screen.  Normal salicylate level.  EKG showed sinus rhythm.  QTC was in the 490s.  Tachycardia improved following IV fluids.  Overall patient continues to metabolize substances taking.  Poison control contacted me back on the phone and they recommend a repeat Tylenol level, lithium level, EKG at around the 4 to 6-hour mark.  We will continue to allow him to metabolize.  Patient to get a head CT just to make sure there was not any trauma or fall involved as his altered mental status occurred while in the hospital, unsure if there is any trauma.  Patient was signed out to oncoming ED staff.  Patient to undergo further medical clearance.  He has been accepted to behavioral health.  IVC has been filled.  This chart was dictated using voice recognition software.  Despite best efforts to proofread,  errors can occur which can change the documentation meaning.   Final Clinical Impressions(s) / ED Diagnoses   Final diagnoses:  Suicidal ideation    ED Discharge Orders    None       Virgina Norfolk, DO 10/28/18 1930    Virgina Norfolk, DO 10/28/18 2033    Virgina Norfolk, DO 10/28/18 2359

## 2018-10-28 NOTE — ED Provider Notes (Signed)
11:15 PM  Assumed care from Dr. Lockie Molauratolo.  Patient is a 38 year old male with history of substance abuse who presented to the emergency department with suicidal thoughts.  [facility]  While in the emergency department patient became unresponsive.  Was found to have lithium and Seroquel tablets on his person.  Unclear if patient overdosed.  This occurred possibly around 9:30 PM.  Poison control recommended 8-hour observation and 4-hour lithium and Tylenol levels.  Labs currently unremarkable.  EKG shows sinus tachycardia.  On my reevaluation, patient is arousable to voice and will open his eyes but does not answer questions or follow commands.  He continues to be tachycardic but otherwise hemodynamically stable.  Pupils are approximately 4 to 5 mm and equal reactive bilaterally.  No clonus or hyperreflexia on exam.  Patient is now under IVC.  It appears patient is prescribed Seroquel, Prozac, Vistaril.    EKG Interpretation  Date/Time:  Sunday October 28 2018 23:58:27 EDT Ventricular Rate:  117 PR Interval:    QRS Duration: 94 QT Interval:  336 QTC Calculation: 469 R Axis:   25 Text Interpretation:  Sinus tachycardia RSR' in V1 or V2, probably normal variant Nonspecific T abnormalities, lateral leads Confirmed by Virgina NorfolkAdam, Curat[place] [phone]([id number]) on 10/29/2018 12:05:12 AM      2:35 AM  Pt now more arousable but still not answering questions or following commands.  Repeat EKG shows no change compared to previous.  He is still tachycardic intermittently.  Patient will be observed until 6 AM for medical clearance.  Head CT is negative.    EKG Interpretation  Date/Time:  Monday October 29 2018 02:24:38 EDT Ventricular Rate:  112 PR Interval:    QRS Duration: 94 QT Interval:  366 QTC Calculation: 500 R Axis:   28 Text Interpretation:  Sinus tachycardia RSR' in V1 or V2, probably normal variant Nonspecific T abnormalities, lateral leads Borderline prolonged  QT interval No significant change since last tracing Confirmed by Ward, Baxter HireKristen [phone]([id number]) on 10/29/2018 2:34:20 AM      3:25 AM  Pt's repeat lithium and Tylenol levels are negative.  He will be observed until 6 AM and reassess for medical clearance.  6:25 AM  Pt has been monitored for 12 hours in the emergency department.  It is unclear if he truly overdosed while here in the emergency department or at home.  Per behavioral health note at 7:51 PM yesterday, patient reported he ingested Seroquel tablets 3 hours prior to arrival.  On my reevaluation, patient is still intermittently tachycardic.  He is arousable and will open his eyes and move all extremities and intermittently becomes combative.  He is able to tell me his name and drink a few sips of water but still appears encephalopathic.  At this time I do not feel he is medically cleared and will need continued monitoring in the ED.  TTS is looking for placement for patient.  He is under IVC.  I reviewed all nursing notes, vitals, pertinent previous records, EKGs, lab and urine results, imaging (as available).    CRITICAL CARE Performed by: Rochele RaringKristen Ward   Total critical care time: 45 minutes  Critical care time was exclusive of separately billable procedures and treating other patients.  Critical care was necessary to treat or prevent imminent or life-threatening deterioration.  Critical care was time spent personally by me on the following activities: development of treatment plan with patient and/or surrogate as well as nursing, discussions with  consultants, evaluation of patient's response to treatment, examination of patient, obtaining history from patient or surrogate, ordering and performing treatments and interventions, ordering and review of laboratory studies, ordering and review of radiographic studies, pulse oximetry and re-evaluation of patient's condition.    Ward, Delice Bison, DO 10/29/18 [phone]

## 2018-10-28 NOTE — Progress Notes (Signed)
Patient ID: Robert Bass, male   DOB: [birth year], 38 y.o.   MRN: [id number]   D: Pt alert and oriented on the unit.   A: Education, support, and encouragement provided. Discharge summary, medications and follow up appointments reviewed with pt. Suicide prevention resources provided, including "My 3 App." Pt's belongings in locker # 13 returned and belongings sheet signed.  R: Pt denies SI/HI, A/VH, pain, or any concerns at this time. Pt ambulatory on and off unit. Pt discharged to lobby.

## 2018-10-28 NOTE — Discharge Summary (Addendum)
Patient seen and case discussed with Dr. Darleene Cleaver. Patient reports improvement in symptoms. At this time he is not participating in any activities and has remained in his bed most of the admission. He continues to report excessive daytime sleepiness. He is requesting a transfer to [facility] for ongoing management of his medication. He is advised that he does not meet criteria for inpatient at this time and will be discharged from our observation unit. Upon chart review patient has been taking testosterone injections, which could explain his ongoing mood disturbances and suicidal thoughts. He also has a history of substances abuse and malingering. At this time patient denies SI/HI/AVH and will be discharged from [facility] observation unit. Will discharge with outpatient resources and consult to social worker.   Patient seen face-to-face for psychiatric evaluation, chart reviewed and case discussed with the physician extender and developed treatment plan. Reviewed the information documented and agree with the treatment plan. Corena Pilgrim, MD

## 2018-10-28 NOTE — ED Notes (Signed)
TTS at bedside. 

## 2018-10-28 NOTE — ED Triage Notes (Signed)
Pt states that he is depressed and SI. Pt states that he has been feeling down. Pt tearful. Pt states he is going to OD on meds.

## 2018-10-28 NOTE — BH Assessment (Signed)
Faxed clinical information to the following facilities for placement:   [place]     [facility]     [place]     [place], [facility], [facility], [facility] Triage Specialist [phone]

## 2018-10-28 NOTE — ED Notes (Signed)
Pharmacy tech states that patient appears to be sleeping with his eyes open. EMT at bedside.

## 2018-10-28 NOTE — ED Notes (Signed)
All belongings placed in green sack and placed near the paper shredder on the resus side.

## 2018-10-28 NOTE — BH Assessment (Signed)
Tele Assessment Note   Patient Name: Robert Bass MRN: [id number] Referring Physician: Virgina NorfolkAdam Curatolo, MD Location of Patient: [facility] ED, [phone] Location of Provider: Behavioral Health TTS Department  Robert Bass is an 38 y.o. single male who presents unaccompanied to [facility] ED reporting he attempted suicide by ingesting 100 mg of Seroquel approximately three hours ago. Pt [facility] counselor [facility] and [facility]. He was evaluated by Dr. Cyndia DiverM Akintayo, psychiatrically cleared and discharged. Pt reports after he was discharged he told law enforcement he was feeling suicidal and the officer [facility].  Pt appears distraught and tearful. He says he is currently suicidal with a plan to overdose on medications. He says is severely depressed and is experiencing thoughts. He says he feels tired, tremulous and overwhelmed. Pt says he recently stopped using heroin and methamphetamines. He says he would [place] be closer to family. He states he feels he has no support at this time. Pt says he was recently prescribed Seroquel and doesn't feel it is helping his depressive symptoms. He denies current homicidal ideation or history of aggression. He denies auditory or visual hallucinations. He denies using any substances within the past five weeks, however Peer Support documented that Pt last used substances two weeks ago.  Pt cannot identify anyone to contact for collateral information.  Pt is alert and oriented x4. Pt speaks in a clear tone, at moderate volume and normal pace. Motor behavior appears restless. Eye contact is good and Pt is tearful. Pt's mood is depressed and anxious; affect is congruent with mood. Thought process is coherent and relevant. There is no indication Pt is currently responding to internal stimuli or experiencing  delusional thought content. Pt was cooperative throughout assessment. He is requesting inpatient dual-diagnosis treatment.  Note from Dr. Cyndia DiverM. Akintayo at [phone] today:  Patient seen and case discussed with Dr. Jannifer FranklinAkintayo. Patient reports improvement in symptoms. At this time he is not participating in any activities and has remained in his bed most of the admission. He continues to report excessive daytime sleepiness. He is [facility] ongoing management of his medication. He is advised that he does not meet criteria for inpatient at this time and will be discharged from our observation unit. Upon chart review patient has been taking testosterone injections, which could explain his ongoing mood disturbances and suicidal thoughts. He also has a history of substances abuse and malingering. At this time patient denies SI/HI/AVH and [facility]. Will discharge with outpatient resources and consult to social worker.    Patient seen face-to-face for psychiatric evaluation, chart reviewed and case discussed with the physician extender and developed treatment plan. Reviewed the information documented and agree with the treatment plan.  Note from Shela CommonsFord Lakeyta Vandenheuvel Jr, [facility] on 10/27/18 at 0610:  Robert Bass is an 38 y.o. single male who presents unaccompanied to [facility] ED reporting depressive symptoms including suicidal ideation. Pt is a poor historian because he is very sleepy and appears groggy. Pt reports he has a history of depression and has felt severely depressed for the past week. He says he is very upset because his in-laws have custody of his 36 year old son and will not let Pt see him. He reports having thoughts of hurting in-laws with no plan or intent. Pt  reports current suicidal ideation with plan to overdose on pills. He denies previous suicide attempts. He reports his father died by suicide. Pt acknowledges symptoms including crying spells,  social withdrawal, loss of interest in usual pleasures, fatigue, irritability, decreased concentration, decreased sleep and feelings of worthlessness and hopelessness. He denies intentional self-injurious behavior. He denies history of aggressive behavior. He denies any history of auditory or visual hallucinations. Pt report he has a history of using drugs, including methamphetamines, but denies recent use. Pt's urine drugs and alcohol screens are negative.   Pt identifies inability to see son as his primary stressor. He says he lives alone. He reports a history of being convicted of manufacturing methamphetamines. He cannot identify and social supports. Pt says he was psychiatrically hospitalized approximately one month ago at a facility in [place], [place]. He says he was discharged with Seroquel for sleep but no antidepressants.    Pt is dressed in hospital scrubs bottoms and no shirt. He is very drowsy and oriented to person, place and situation. Pt speaks in a mumbled tone, at low volume and normal pace. Motor behavior appears normal. Eye contact is minimal. Pt's mood is depressed and affect is congruent with mood. Thought process is coherent and relevant. There is no indication Pt is currently responding to internal stimuli or experiencing delusional thought content. Pt was cooperative throughout assessment. He says he is willing to sign voluntarily into a psychiatric facility.   Diagnosis: F33.2 Major depressive disorder, Recurrent episode, Severe  Past Medical History:  Past Medical History:  Diagnosis Date  . Hepatitis   . Hypertension     History reviewed. No pertinent surgical history.  Family History: No family history on file.  Social History:  reports that he has been smoking cigarettes. He has been smoking about 0.50 packs per day. He has never used smokeless tobacco. He reports current alcohol use. He reports current drug use. Drugs: Methamphetamines and Heroin.  Additional  Social History:  Alcohol / Drug Use Pain Medications: Pt has history of abusing opiates Prescriptions: Pt reports he took 1100 mg of Seroquel today Over the Counter: Denies abuse History of alcohol / drug use?: Yes(Pt reports history of using heroin and methamphetamines. Denies use in five weeks) Longest period of sobriety (when/how long): 5 weeks currently Negative Consequences of Use: Financial, Legal, Personal relationships, Work / School Withdrawal Symptoms: (None)  CIWA: CIWA-Ar BP: (!) 160/104 Pulse Rate: 80 COWS:    Allergies: No Known Allergies  Home Medications: (Not in a hospital admission)   OB/GYN Status:  No LMP for male patient.  General Assessment Data Assessment unable to be completed: Yes Reason for not completing assessment: (cart being used) Location of Assessment: [facility] TTS Assessment: In system Is this a Tele or Face-to-Face Assessment?: Tele Assessment Is this an Initial Assessment or a Re-assessment for this encounter?: Initial Assessment Patient Accompanied by:: N/A Language Other than English: No Living Arrangements: Other (Comment)(Lives alone) What gender do you identify as?: Male Marital status: Single Maiden name: NA Pregnancy Status: No Living Arrangements: Alone Can pt return to current living arrangement?: Yes Admission Status: Voluntary Is patient capable of signing voluntary admission?: Yes Referral Source: Self/Family/Friend Insurance type: Medicaid     Crisis Care Plan Living Arrangements: Alone Legal Guardian: Other:(Self) Name of Psychiatrist: None Name of Therapist: None  Education Status Is patient currently in school?: No Is the patient employed, unemployed or receiving disability?: Unemployed  Risk to self with the past 6 months Suicidal Ideation:  Yes-Currently Present Has patient been a risk to self within the past 6 months prior to admission? : Yes Suicidal Intent: Yes-Currently Present Has patient had any suicidal  intent within the past 6 months prior to admission? : Yes Is patient at risk for suicide?: Yes Suicidal Plan?: Yes-Currently Present Has patient had any suicidal plan within the past 6 months prior to admission? : Yes Specify Current Suicidal Plan: Pt reports he overdosed on Seroquel today in suicide attempt Access to Means: Yes Specify Access to Suicidal Means: Access to Seroquel What has been your use of drugs/alcohol within the last 12 months?: Pt has history of using heroin and methamphetamines Previous Attempts/Gestures: No How many times?: 0 Other Self Harm Risks: None identified Triggers for Past Attempts: None known Intentional Self Injurious Behavior: None Family Suicide History: Yes(Father died by suicide) Recent stressful life event(s): Legal Issues, Other (Comment)(Inlaws have custody of Pt's son) Persecutory voices/beliefs?: No Depression: Yes Depression Symptoms: Despondent, Insomnia, Tearfulness, Isolating, Fatigue, Guilt, Loss of interest in usual pleasures, Feeling worthless/self pity Substance abuse history and/or treatment for substance abuse?: Yes Suicide prevention information given to non-admitted patients: Not applicable  Risk to Others within the past 6 months Homicidal Ideation: No Does patient have any lifetime risk of violence toward others beyond the six months prior to admission? : No Thoughts of Harm to Others: No Comment - Thoughts of Harm to Others: Pt denies current thoughts of harming others Current Homicidal Intent: No Current Homicidal Plan: No Access to Homicidal Means: No Identified Victim: Pt denies current thoughts of harming others History of harm to others?: No Assessment of Violence: None Noted Violent Behavior Description: Pt denies history of violence Does patient have access to weapons?: No Criminal Charges Pending?: No Does patient have a court date: No Is patient on probation?: No  Psychosis Hallucinations: None noted Delusions:  None noted  Mental Status Report Appearance/Hygiene: Unremarkable Eye Contact: Good Motor Activity: Restlessness Speech: Logical/coherent Level of Consciousness: Alert, Crying Mood: Depressed, Anxious, Helpless Affect: Anxious, Depressed Anxiety Level: Moderate Thought Processes: Coherent, Relevant Judgement: Partial Orientation: Person, Place, Time, Situation Obsessive Compulsive Thoughts/Behaviors: None  Cognitive Functioning Concentration: Fair Memory: Recent Intact, Remote Intact Is patient IDD: No Insight: Fair Impulse Control: Fair Appetite: Fair Have you had any weight changes? : No Change Sleep: Decreased Total Hours of Sleep: 3 Vegetative Symptoms: None  ADLScreening [facility]) Patient's cognitive ability adequate to safely complete daily activities?: Yes Patient able to express need for assistance with ADLs?: Yes Independently performs ADLs?: Yes (appropriate for developmental age)  Prior Inpatient Therapy Prior Inpatient Therapy: Yes Prior Therapy Dates: 09/2018 Prior Therapy Facilty/Provider(s): [facility] Reason for Treatment: Depression  Prior Outpatient Therapy Prior Outpatient Therapy: No Does patient have an ACCT team?: No Does patient have Intensive In-House Services?  : No Does patient have [facility] services? : No Does patient have P4CC services?: No  ADL Screening (condition at time of admission) Patient's cognitive ability adequate to safely complete daily activities?: Yes Is the patient deaf or have difficulty hearing?: No Does the patient have difficulty seeing, even when wearing glasses/contacts?: No Does the patient have difficulty concentrating, remembering, or making decisions?: No Patient able to express need for assistance with ADLs?: Yes Does the patient have difficulty dressing or bathing?: No Independently performs ADLs?: Yes (appropriate for developmental age) Does the patient have difficulty walking or climbing  stairs?: No Weakness of Legs: None Weakness of Arms/Hands: None  Home Assistive Devices/Equipment Home Assistive Devices/Equipment: None  Abuse/Neglect Assessment (Assessment to be complete while patient is alone) Abuse/Neglect Assessment Can Be Completed: Yes Physical Abuse: Denies Verbal Abuse: Denies Sexual Abuse: Denies Exploitation of patient/patient's resources: Denies Self-Neglect: Denies     Advance Directives (For Healthcare) Does Patient Have a Medical Advance Directive?: No Would patient like information on creating a medical advance directive?: No - Patient declined          Disposition: Binnie RailJoAnn Glover, [facility], confirmed adult unit is currently at capacity. Gave clinical report to Nira ConnJason Berry, NP who said Pt meets criteria for inpatient psychiatric treatment. TTS will contact other facilities for placement. Notified Dr Virgina NorfolkAdam Curatolo and [facility] staff of recommendation.  Disposition Initial Assessment Completed for this Encounter: Yes  This service was provided via telemedicine using a 2-way, interactive audio and video technology.  Names of all persons participating in this telemedicine service and their role in this encounter. Name: Robert Bass Role: Patient  Name: Shela CommonsFord Mckay Tegtmeyer Jr, [facility] Role: TTS counselor         Harlin RainFord Ellis Patsy BaltimoreWarrick Jr, [facility], [facility], [facility] Triage Specialist [phone]  Pamalee LeydenWarrick Jr, Truth Barot Ellis 10/28/2018 7:51 PM

## 2018-10-28 NOTE — ED Notes (Signed)
EMT advised this RN that patient is not easily aroused. Curatolo MD to reassess patient.

## 2018-10-28 NOTE — ED Notes (Addendum)
MD at bedside and notified poison control. Patient altered at this time so psych assessment and suicide assessment cannot be completed at this time. Will complete assessments once patient is alert and oriented. Will continue to monitor patient.

## 2018-10-28 NOTE — ED Notes (Signed)
I attempted to arouse this patient to get him changed into wine scrubs. He was not alert to voice or pain. Vitals reassessed and he was found to be tachycardic. I placed him on the cardiac monitor. Then, I alerted Dr. Lennice Sites of his status. We looked through his personal bag, and found an empty bottle of seroquel and a bottle of lithium. Prior to all this, this patient was seen taking a couple trips back and forth to the bathroom.

## 2018-10-29 ENCOUNTER — Emergency Department (HOSPITAL_COMMUNITY): Payer: No Typology Code available for payment source

## 2018-10-29 DIAGNOSIS — T43592A Poisoning by other antipsychotics and neuroleptics, intentional self-harm, initial encounter: Secondary | ICD-10-CM

## 2018-10-29 DIAGNOSIS — R4702 Dysphasia: Secondary | ICD-10-CM

## 2018-10-29 DIAGNOSIS — T1491XA Suicide attempt, initial encounter: Secondary | ICD-10-CM | POA: Diagnosis present

## 2018-10-29 DIAGNOSIS — F17210 Nicotine dependence, cigarettes, uncomplicated: Secondary | ICD-10-CM

## 2018-10-29 LAB — LITHIUM LEVEL: Lithium Lvl: <0.06 mmol/L — ABNORMAL LOW (ref 0.60–1.20)

## 2018-10-29 LAB — ACETAMINOPHEN LEVEL: Acetaminophen (Tylenol), Serum: <10 ug/mL — ABNORMAL LOW (ref 10–30)

## 2018-10-29 MED ORDER — AMMONIA AROMATIC IN INHA
RESPIRATORY_TRACT | Status: AC
  Administered 2018-10-29: 07:00:00
  Filled 2018-10-29: qty 10

## 2018-10-29 MED ORDER — SODIUM CHLORIDE 0.9 % IV SOLN: Freq: Once | INTRAVENOUS | Status: DC

## 2018-10-29 MED ORDER — SODIUM CHLORIDE 0.9 % IV BOLUS (SEPSIS)
1000.0000 mL | Freq: Once | INTRAVENOUS | Status: AC
  Administered 2018-10-29: 01:00:00 1000 mL via INTRAVENOUS

## 2018-10-29 MED ORDER — SODIUM CHLORIDE 0.9 % IV SOLN
INTRAVENOUS | Status: DC
  Administered 2018-10-29: 01:00:00 via INTRAVENOUS

## 2018-10-29 NOTE — ED Notes (Addendum)
Patient had an episode of urinary incontinence. Patient linen changed and peri care performed. Patient is not verbally responsive, but will open eyes to voice and pain. Patient whimpered when being changed. EDP made aware. Will continue to monitor patient.

## 2018-10-29 NOTE — Progress Notes (Signed)
CSW received a call from [place] at [facility] stating the patient has been offered a bed and has been accepted and that the pt can arrive either on 10/29/2018 or after 9am on 10/30/18.  The pt's accepting doctor is [employer].  The room number will be [facility] today or [facility] on 9/8.  The # for report today (9/7) is [phone] and the # for report on 9/8 is  [phone].  CSW will update RN and E  Alphonse Guild. Kymari Nuon, Latanya Presser, LCAS Clinical Social Worker Ph: [phone]

## 2018-10-29 NOTE — Consult Note (Addendum)
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 male patient admitted with possible overdose, patient observed lying on hospital cot today appears asleep, patient verbalizes "I can't talk right now," closed eyes. Nods affirmative when asked if overdosed on seroquel.   HPI:   Limited information at this time, patient continues to appear sedated.  Per EDP Note 10/28/2018: 11:15 PM Patient is a 38 year old male with history of substance abuse who presented to the emergency department with suicidal thoughts.   While in the emergency department patient became unresponsive.  Was found to have lithium and Seroquel tablets on his person.  Unclear if patient overdosed.  This occurred possibly around 9:30 PM.  Poison control recommended 8-hour observation and 4-hour lithium and Tylenol levels. Patient is now under IVC.    Past Psychiatric History: None per chart review  Risk to Self: Suicidal Ideation: Yes-Currently Present Suicidal Intent: Yes-Currently Present Is patient at risk for suicide?: Yes Suicidal Plan?: Yes-Currently Present Specify Current Suicidal Plan: Pt reports he overdosed on Seroquel today in suicide attempt Access to Means: Yes Specify Access to Suicidal Means: Access to Seroquel What has been your use of drugs/alcohol within the last 12 months?: Pt has history of using heroin and methamphetamines How many times?: 0 Other Self Harm Risks: None identified Triggers for Past Attempts: None known Intentional Self Injurious Behavior: None Risk to Others: Homicidal Ideation: No Thoughts of Harm to Others:  No Comment - Thoughts of Harm to Others: Pt denies current thoughts of harming others Current Homicidal Intent: No Current Homicidal Plan: No Access to Homicidal Means: No Identified Victim: Pt denies current thoughts of harming others History of harm to others?: No Assessment of Violence: None Noted Violent Behavior Description: Pt denies history of violence Does patient have access to weapons?: No Criminal Charges Pending?: No Does patient have a court date: No Prior Inpatient Therapy: Prior Inpatient Therapy: Yes Prior Therapy Dates: 09/2018 [facility] Reason for Treatment: Depression Prior Outpatient Therapy: Prior Outpatient Therapy: No Does patient have an ACCT team?: No Does patient have Intensive In-House Services?  : No Does patient have [facility] services? : No Does patient have P4CC services?: No  Past Medical History:  Past Medical History:  Diagnosis Date  . Hepatitis   . Hypertension    History reviewed. No pertinent surgical history. Family History: No family history on file. Family Psychiatric  History: Non per chart review Social History:  Social History   Substance and Sexual Activity  Alcohol Use Yes     Social History   Substance and Sexual Activity  Drug Use Yes  . Types: Methamphetamines, Heroin    Social History   Socioeconomic History  . Marital status: Divorced    Spouse name: Not on file  . Number of children: Not on file  . Years of education: Not on file  . Highest education level: Not on file  Occupational History  . Not on file  Social Needs  . Financial resource strain: Not on file  . Food insecurity    Worry: Not on file    Inability: Not on file  . Transportation needs  Medical: Not on file    Non-medical: Not on file  Tobacco Use  . Smoking status: Current Every Day Smoker    Packs/day: 0.50    Types: Cigarettes  . Smokeless tobacco: Never Used  Substance and Sexual Activity  .  Alcohol use: Yes  . Drug use: Yes    Types: Methamphetamines, Heroin  . Sexual activity: Not Currently  Lifestyle  . Physical activity    Days per week: Not on file    Minutes per session: Not on file  . Stress: Not on file  Relationships  . Social Musician on phone: Not on file    Gets together: Not on file    Attends religious service: Not on file    Active member of club or organization: Not on file    Attends meetings of clubs or organizations: Not on file    Relationship status: Not on file  Other Topics Concern  . Not on file  Social History Narrative  . Not on file   Additional Social History: N/A    Allergies:  No Known Allergies  Labs:  Results for orders placed or performed during the hospital encounter of 10/28/18 (from the past 48 hour(s))  Rapid urine drug screen (hospital performed)     Status: None   Collection Time: 10/28/18  6:04 PM  Result Value Ref Range   Opiates NONE DETECTED NONE DETECTED   Cocaine NONE DETECTED NONE DETECTED   Benzodiazepines NONE DETECTED NONE DETECTED   Amphetamines NONE DETECTED NONE DETECTED   Tetrahydrocannabinol NONE DETECTED NONE DETECTED   Barbiturates NONE DETECTED NONE DETECTED    Comment: (NOTE) DRUG SCREEN FOR MEDICAL PURPOSES ONLY.  IF CONFIRMATION IS NEEDED FOR ANY PURPOSE, NOTIFY LAB WITHIN 5 DAYS. LOWEST DETECTABLE LIMITS FOR URINE DRUG SCREEN Drug Class                     Cutoff (ng/mL) Amphetamine and metabolites    1000 Barbiturate and metabolites    200 Benzodiazepine                 200 Tricyclics and metabolites     300 Opiates and metabolites        300 Cocaine and metabolites        300 THC                            50 Performed at [facility], [address], [place], [place] [address]   Comprehensive metabolic panel     Status: None   Collection Time: 10/28/18  6:22 PM  Result Value Ref Range   Sodium 141 135 - 145 mmol/L   Potassium 3.8 3.5 - 5.1 mmol/L    Chloride 103 98 - 111 mmol/L   CO2 28 22 - 32 mmol/L   Glucose, Bld 96 70 - 99 mg/dL   BUN 13 6 - 20 mg/dL   Creatinine, Ser 6.04 0.61 - 1.24 mg/dL   Calcium 9.8 8.9 - 54.0 mg/dL   Total Protein 8.1 6.5 - 8.1 g/dL   Albumin 4.8 3.5 - 5.0 g/dL   AST 25 15 - 41 U/L   ALT 27 0 - 44 U/L   Alkaline Phosphatase 49 38 - 126 U/L   Total Bilirubin 0.9 0.3 - 1.2 mg/dL   GFR calc non Af Amer >60 >60 mL/min   GFR calc Af Amer >60 >60 mL/min  Anion gap 10 5 - 15    Comment: Performed at [facility], [address], [place], [place] [address]  Ethanol     Status: None   Collection Time: 10/28/18  6:22 PM  Result Value Ref Range   Alcohol, Ethyl (B) <10 <10 mg/dL    Comment: (NOTE) Lowest detectable limit for serum alcohol is 10 mg/dL. For medical purposes only. Performed at [facility], [address], [place], [place] [address]   Salicylate level     Status: None   Collection Time: 10/28/18  6:22 PM  Result Value Ref Range   Salicylate Lvl <7.0 2.8 - 30.0 mg/dL    Comment: Performed at [facility], [address], [place], [place] [address]  Acetaminophen level     Status: Abnormal   Collection Time: 10/28/18  6:22 PM  Result Value Ref Range   Acetaminophen (Tylenol), Serum <10 (L) 10 - 30 ug/mL    Comment: (NOTE) Therapeutic concentrations vary significantly. A range of 10-30 ug/mL  may be an effective concentration for many patients. However, some  are best treated at concentrations outside of this range. Acetaminophen concentrations >150 ug/mL at 4 hours after ingestion  and >50 ug/mL at 12 hours after ingestion are often associated with  toxic reactions. Performed at [facility], [address] [place], [place] [address]   cbc     Status: None   Collection Time: 10/28/18  6:22 PM  Result Value Ref Range   WBC 10.4 4.0 - 10.5 K/uL   RBC 5.19 4.22 - 5.81 MIL/uL   Hemoglobin 15.6 13.0 - 17.0 g/dL    HCT 18.5 50.1 - 58.6 %   MCV 90.8 80.0 - 100.0 fL   MCH 30.1 26.0 - 34.0 pg   MCHC 33.1 30.0 - 36.0 g/dL   RDW 82.5 74.9 - 35.5 %   Platelets 226 150 - 400 K/uL   nRBC 0.0 0.0 - 0.2 %    Comment: Performed at [facility], [address], [place], [place] [address]  SARS Coronavirus 2 [facility] order, Performed in [facility] hospital lab) Nasopharyngeal Nasopharyngeal Swab     Status: None   Collection Time: 10/28/18  8:33 PM   Specimen: Nasopharyngeal Swab  Result Value Ref Range   SARS Coronavirus 2 NEGATIVE NEGATIVE    Comment: (NOTE) If result is NEGATIVE SARS-CoV-2 target nucleic acids are NOT DETECTED. The SARS-CoV-2 RNA is generally detectable in upper and lower  respiratory specimens during the acute phase of infection. The lowest  concentration of SARS-CoV-2 viral copies this assay can detect is 250  copies / mL. A negative result does not preclude SARS-CoV-2 infection  and should not be used as the sole basis for treatment or other  patient management decisions.  A negative result may occur with  improper specimen collection / handling, submission of specimen other  than nasopharyngeal swab, presence of viral mutation(s) within the  areas targeted by this assay, and inadequate number of viral copies  (<250 copies / mL). A negative result must be combined with clinical  observations, patient history, and epidemiological information. If result is POSITIVE SARS-CoV-2 target nucleic acids are DETECTED. The SARS-CoV-2 RNA is generally detectable in upper and lower  respiratory specimens dur ing the acute phase of infection.  Positive  results are indicative of active infection with SARS-CoV-2.  Clinical  correlation with patient history and other diagnostic information is  necessary to determine patient  infection status.  Positive results do  not rule out bacterial infection or co-infection with other viruses. If result is PRESUMPTIVE  POSTIVE SARS-CoV-2 nucleic acids MAY BE PRESENT.   A presumptive positive result was obtained on the submitted specimen  and confirmed on repeat testing.  While 2019 novel coronavirus  (SARS-CoV-2) nucleic acids may be present in the submitted sample  additional confirmatory testing may be necessary for epidemiological  and / or clinical management purposes  to differentiate between  SARS-CoV-2 and other Sarbecovirus currently known to infect humans.  If clinically indicated additional testing with an alternate test  methodology [phone] is advised. The SARS-CoV-2 RNA is generally  detectable in upper and lower respiratory sp ecimens during the acute  phase of infection. The expected result is Negative. Fact Sheet for Patients:  BoilerBrush.com.cyhttps://www.fda.gov/media/136312/download Fact Sheet for Healthcare Providers: https://pope.com/https://www.fda.gov/media/136313/download This test is not yet approved or cleared by the [place] FDA and has been authorized for detection and/or diagnosis of SARS-CoV-2 by FDA under an Emergency Use Authorization (EUA).  This EUA will remain in effect (meaning this test can be used) for the duration of the COVID-19 declaration under Section 564(b)(1) of the Act, 21 U.S.C. section 360bbb-3(b)(1), unless the authorization is terminated or revoked sooner. Performed at [facility], [address] [place], [place] [address]   Lithium level     Status: Abnormal   Collection Time: 10/28/18  9:05 PM  Result Value Ref Range   Lithium Lvl <0.06 (L) 0.60 - 1.20 mmol/L    Comment: Performed at [facility], [address] [place], [place] [address]  Basic metabolic panel     Status: Abnormal   Collection Time: 10/28/18  9:05 PM  Result Value Ref Range   Sodium 139 135 - 145 mmol/L   Potassium 3.4 (L) 3.5 - 5.1 mmol/L   Chloride 103 98 - 111 mmol/L   CO2 24 22 - 32 mmol/L   Glucose, Bld 117 (H) 70 - 99 mg/dL   BUN 13 6 - 20 mg/dL    Creatinine, Ser 4.781.00 0.61 - 1.24 mg/dL   Calcium 9.2 8.9 - 29.510.3 mg/dL   GFR calc non Af Amer >60 >60 mL/min   GFR calc Af Amer >60 >60 mL/min   Anion gap 12 5 - 15    Comment: Performed at [facility], [address] [place], [place] [address]  Rapid urine drug screen (hospital performed)     Status: None   Collection Time: 10/28/18  9:23 PM  Result Value Ref Range   Opiates NONE DETECTED NONE DETECTED   Cocaine NONE DETECTED NONE DETECTED   Benzodiazepines NONE DETECTED NONE DETECTED   Amphetamines NONE DETECTED NONE DETECTED   Tetrahydrocannabinol NONE DETECTED NONE DETECTED   Barbiturates NONE DETECTED NONE DETECTED    Comment: (NOTE) DRUG SCREEN FOR MEDICAL PURPOSES ONLY.  IF CONFIRMATION IS NEEDED FOR ANY PURPOSE, NOTIFY LAB WITHIN 5 DAYS. LOWEST DETECTABLE LIMITS FOR URINE DRUG SCREEN Drug Class                     Cutoff (ng/mL) Amphetamine and metabolites    1000 Barbiturate and metabolites    200 Benzodiazepine                 200 Tricyclics and metabolites     300 Opiates and metabolites        300 Cocaine and metabolites        300 THC  50 Performed at [facility], [place] [address] [place], [place] [address]   CBC with Differential     Status: None   Collection Time: 10/28/18  9:24 PM  Result Value Ref Range   WBC 8.7 4.0 - 10.5 K/uL   RBC 4.69 4.22 - 5.81 MIL/uL   Hemoglobin 14.4 13.0 - 17.0 g/dL   HCT 42.0 39.0 - 52.0 %   MCV 89.6 80.0 - 100.0 fL   MCH 30.7 26.0 - 34.0 pg   MCHC 34.3 30.0 - 36.0 g/dL   RDW 12.5 11.5 - 15.5 %   Platelets 195 150 - 400 K/uL   nRBC 0.0 0.0 - 0.2 %   Neutrophils Relative % 76 %   Neutro Abs 6.6 1.7 - 7.7 K/uL   Lymphocytes Relative 15 %   Lymphs Abs 1.3 0.7 - 4.0 K/uL   Monocytes Relative 8 %   Monocytes Absolute 0.7 0.1 - 1.0 K/uL   Eosinophils Relative 0 %   Eosinophils Absolute 0.0 0.0 - 0.5 K/uL   Basophils Relative 1 %   Basophils Absolute 0.0 0.0 -  0.1 K/uL   Immature Granulocytes 0 %   Abs Immature Granulocytes 0.03 0.00 - 0.07 K/uL    Comment: Performed at [facility], [place] [address] [place], [place] [address]  Salicylate level     Status: None   Collection Time: 10/28/18  9:33 PM  Result Value Ref Range   Salicylate Lvl <2.9 2.8 - 30.0 mg/dL    Comment: Performed at [facility], [place] [address], [place], [place] [address]  Lithium level     Status: Abnormal   Collection Time: 10/29/18  2:00 AM  Result Value Ref Range   Lithium Lvl <0.06 (L) 0.60 - 1.20 mmol/L    Comment: REPEATED TO VERIFY Performed at [place] [address], [place], [place] [address]   Acetaminophen level     Status: Abnormal   Collection Time: 10/29/18  2:00 AM  Result Value Ref Range   Acetaminophen (Tylenol), Serum <10 (L) 10 - 30 ug/mL    Comment: (NOTE) Therapeutic concentrations vary significantly. A range of 10-30 ug/mL  may be an effective concentration for many patients. However, some  are best treated at concentrations outside of this range. Acetaminophen concentrations >150 ug/mL at 4 hours after ingestion  and >50 ug/mL at 12 hours after ingestion are often associated with  toxic reactions. Performed at [facility], [place] [address], [place],  [address]     Medications:  Current Facility-Administered Medications  Medication Dose Route Frequency Provider Last Rate Last Dose  . 0.9 %  sodium chloride infusion   Intravenous Continuous Ward, Kristen N, DO   Stopped at 10/29/18 0700   Current Outpatient Medications  Medication Sig Dispense Refill  . hydrOXYzine (ATARAX/VISTARIL) 25 MG tablet Take 1 tablet (25 mg total) by mouth 3 (three) times daily as needed for anxiety. 30 tablet 0  . QUEtiapine (SEROQUEL XR) 300 MG 24 hr tablet Take 300 mg by mouth at bedtime.    Marland Kitchen FLUoxetine (PROZAC) 10 MG capsule Take 1 capsule (10 mg total) by mouth daily. 30  capsule 0    Musculoskeletal: Strength & Muscle Tone: No atrophy noted. Gait & Station: UTA since patient is lying in bed. Patient leans: N/A  Psychiatric Specialty Exam: Physical Exam  Nursing note and vitals reviewed. Constitutional: He is oriented to person, place, and time. He appears well-developed.  HENT:  Head: Normocephalic.  Cardiovascular: Normal rate.  Respiratory: Effort normal.  Neurological: He is alert and oriented to person, place, and time.  Psychiatric: His speech is slurred (Patient appears to have dysphasia). He is slowed. Cognition and memory are impaired. He expresses inappropriate judgment. He exhibits a depressed mood. He expresses suicidal ideation.    Review of Systems  Constitutional: Negative.   HENT: Negative.   Eyes: Negative.   Respiratory: Negative.   Cardiovascular: Negative.   Gastrointestinal: Negative.   Genitourinary: Negative.   Musculoskeletal: Negative.   Skin: Negative.   Neurological: Negative.   Endo/Heme/Allergies: Negative.   Psychiatric/Behavioral: Positive for depression and suicidal ideas.    Blood pressure (!) 156/117, pulse 98, temperature 98 F (36.7 C), resp. rate 19, height 5\' 6"  (1.676 m), weight 59 kg, SpO2 98 %.Body mass index is 20.98 kg/m.  General Appearance: Disheveled  Eye Contact:  Poor  Speech:  Slurred  Volume:  Decreased  Mood:  Depressed  Affect:  Flat  Thought Process:  Disorganized and Descriptions of Associations: Tangential  Orientation:  Other:  Unknown  Thought Content:  unable to assess  Suicidal Thoughts:  unable to assess  Homicidal Thoughts:  unable to assess  Memory:  unable to assess  Judgement:  Other:  unable to assess  Insight:  unable to assess  Psychomotor Activity:  Normal  Concentration:  Concentration: unable to assess  Recall:  unable to assess  Fund of Knowledge:  unable to assess  Language:  Fair  Akathisia:  No  Handed:  Right  AIMS (if indicated):   N/A  Assets:   Others:  unable to assess  ADL's:  Impaired  Cognition:  Impaired due to altered mental status.   Sleep:   N/A     Treatment Plan Summary: Daily contact with patient to assess and evaluate symptoms and progress in treatment  Disposition: Recommend psychiatric Inpatient admission when medically cleared.  This service was provided via telemedicine using a 2-way, interactive audio and video technology.  Names of all persons participating in this telemedicine service and their role in this encounter. Name: Robert Bass Patient  Berneice Heinrich FNP  J. Sharma Covert MD    Patrcia Dolly, FNP 10/29/2018 12:46 PM  Patient seen by telemedicine for psychiatric evaluation, chart reviewed and case discussed with the physician extender and developed treatment plan. Reviewed the information documented and agree with the treatment plan.  Juanetta Beets, DO 10/30/18 5:19 PM

## 2018-10-29 NOTE — ED Notes (Signed)
Patient transported to CT 

## 2018-10-29 NOTE — ED Notes (Signed)
Patient able to open eyes to voice and pain, but not able to give verbal response to questions. Patient able to move limbs and follow commands. Will continue to monitor patient.

## 2018-10-29 NOTE — ED Notes (Signed)
After obtaining another EKG at the suggestion of poison control he has been cleared.

## 2018-10-29 NOTE — ED Notes (Signed)
Pt came to unit calm and cooperative.  Belongings in large green duffel bag placed in day room.  Pt is in no distress and 15 minute checks and video monitoring initiated.

## 2018-10-29 NOTE — ED Notes (Signed)
Sheriff will transport to [facility].

## 2018-10-29 NOTE — ED Notes (Signed)
Sitter asked for assistance as patient had been pulling at IV, EKG leads and restless in bed. This RN went in [place] nd helped reposition patient.

## 2018-10-29 NOTE — Progress Notes (Signed)
RN updated.  Please reconsult if future social work needs arise.  CSW signing off, as social work intervention is no longer needed.  Tyion Boylen F. Mohogany Toppins, LCSW, LCAS, CSI Transitions of Care Clinical Social Worker Care Coordination Department Ph: [phone]       

## 2018-10-29 NOTE — ED Notes (Signed)
Pt requesting his Bible. Explained to pt that he is allowed to have his Bible at this time as long as he remains calm and cooperative.

## 2018-10-29 NOTE — BH Assessment (Signed)
[facility] Assessment Progress Note  Per Buford Dresser, DO, this pt requires psychiatric hospitalization at this time.  Pt presents under IVC initiated by EDP Lennice Sites, DO.  PThe following facilities have been contacted to seek placement for this pt, with results as noted:  Beds available, information sent, decision pending:  [place] Coordinator [phone]

## 2018-10-30 NOTE — ED Notes (Addendum)
Pt to [facility] per provider. Pt alert. Calm, cooperative, no s/s of distress. DC information given to sheriff for [facility]. Belongings given to sheriff for [facility]. Pt ambulatory off unit escorted by sheriff. Pt transported by sheriff.

## 2018-10-30 NOTE — Progress Notes (Signed)
Received Robert Bass at shift change in his bed asleep, later he was OOB and received a snack of his choice. He slept throughout the night except for 2 hrs.

## 2018-10-30 NOTE — ED Notes (Signed)
Patient ate his breakfast 

## 2020-04-21 DEATH — deceased

## 2021-05-06 IMAGING — CT CT HEAD W/O CM
3 of 4 series · 16 of 47 positions shown, 19 images · non-contrast
Comparison: None.

CLINICAL DATA: Frequent falls

EXAM:
CT HEAD WITHOUT CONTRAST
TECHNIQUE: Contiguous axial images were obtained from the base of the skull
through the vertex without intravenous contrast.

[Series 2: head wo · axial · 0.46mm/px · z∈[+1573,+1713]mm · 10 of 32 slices shown, 13 images]
[im 2/32  brain]
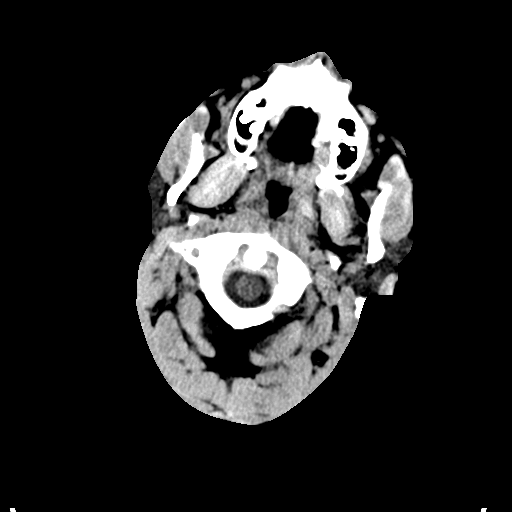
[im 2/32  bone]
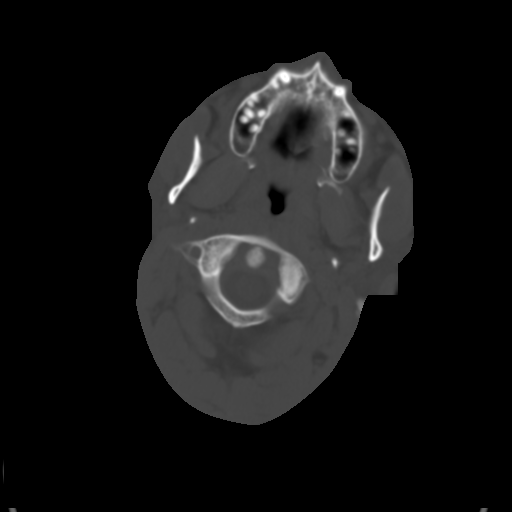
[im 5/32  brain]
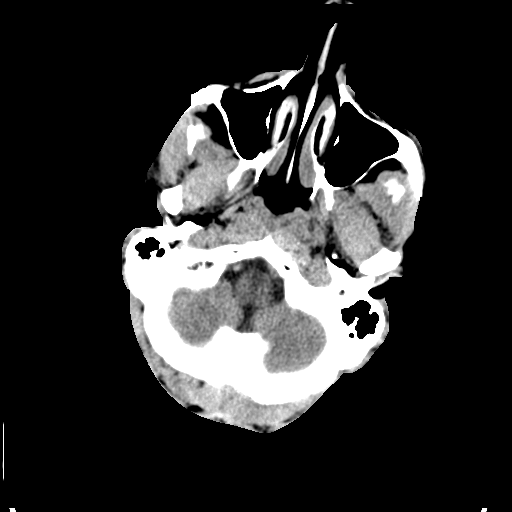
[im 9/32  brain]
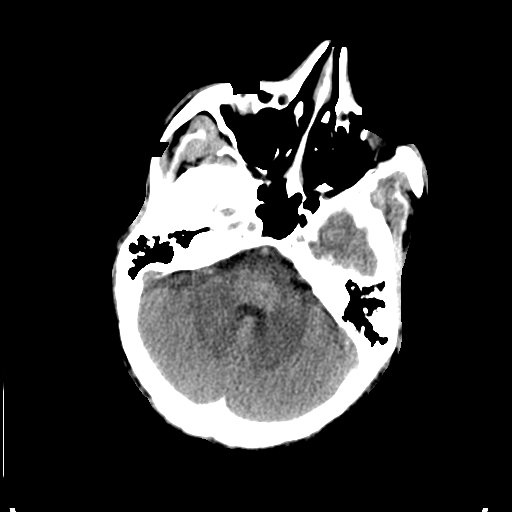
[im 12/32  brain]
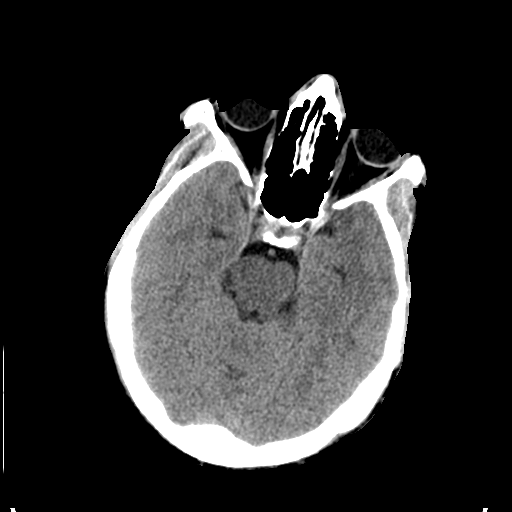
[im 15/32  brain]
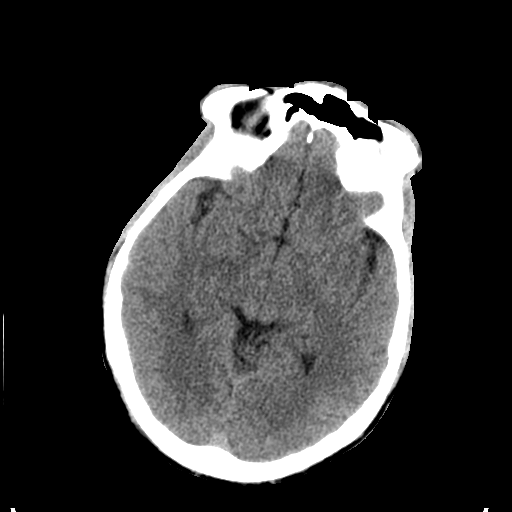
[im 15/32  bone]
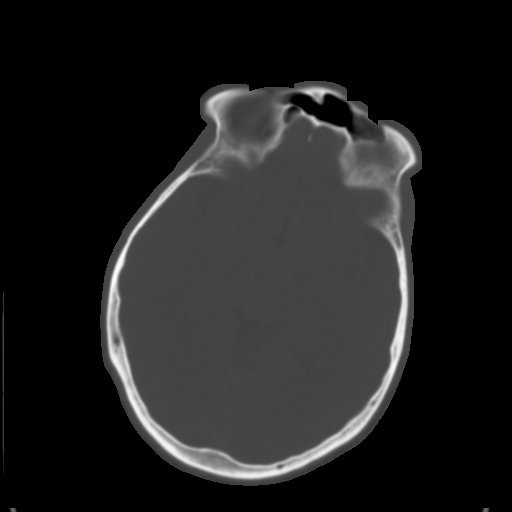
[im 17/32  brain]
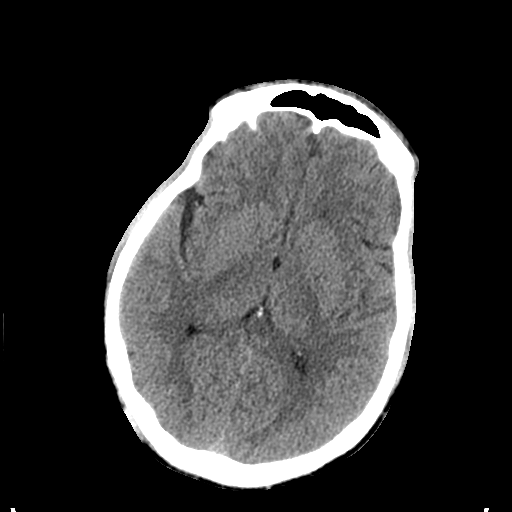
[im 20/32  brain]
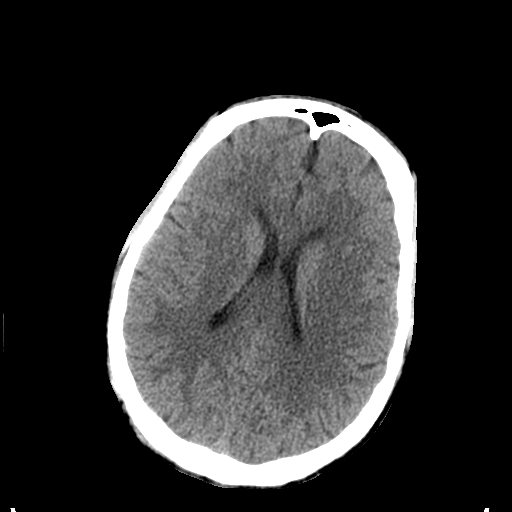
[im 23/32  brain]
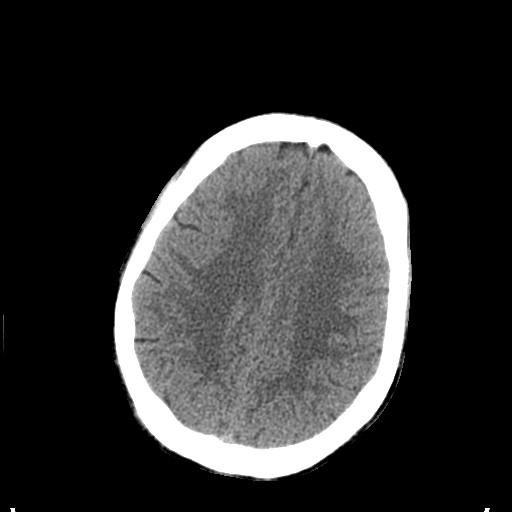
[im 27/32  brain]
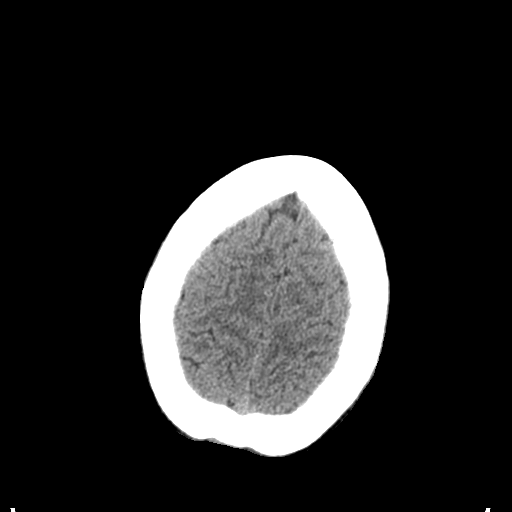
[im 27/32  bone]
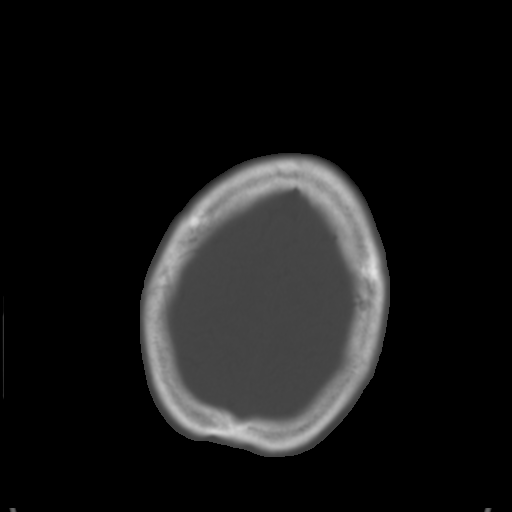
[im 30/32  brain]
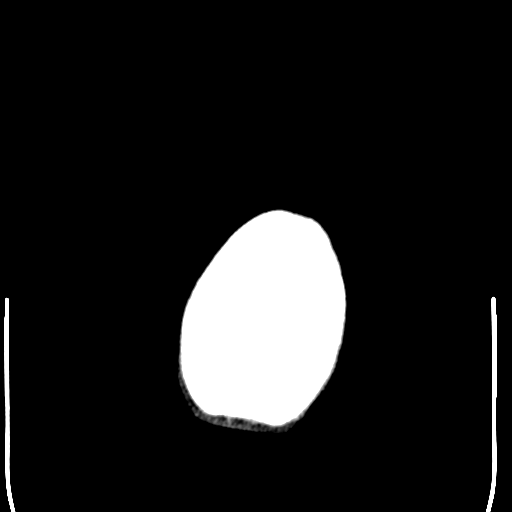

[Series 5: coronal soft tissue · coronal · 0.33mm/px · 3 of 78 slices shown]
[im 26/78  brain]
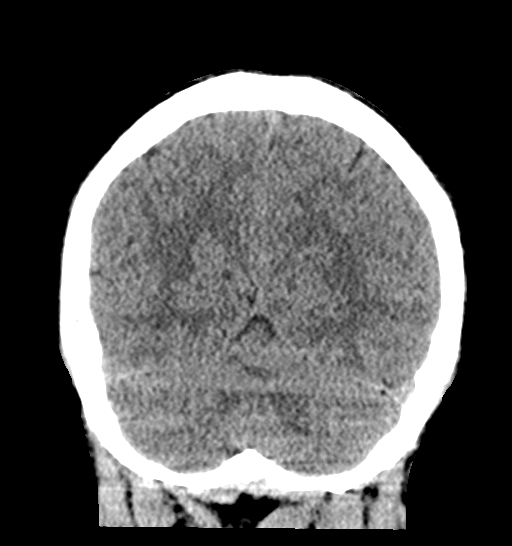
[im 35/78  brain]
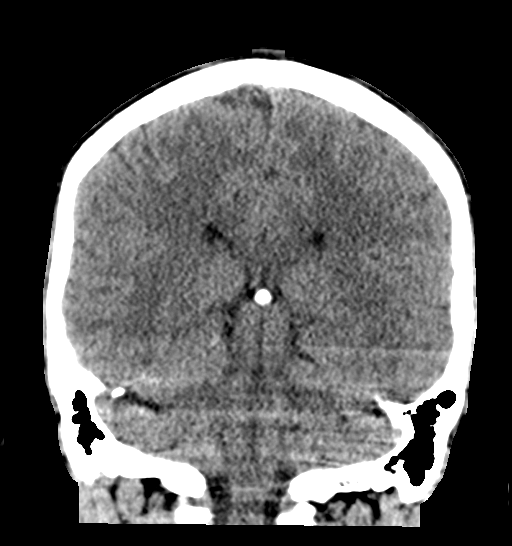
[im 43/78  brain]
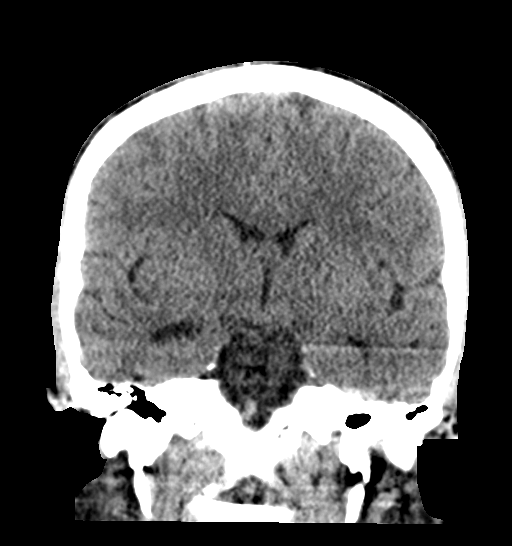

[Series 6: sagittal soft tissue · sagittal · 0.33mm/px · 3 of 55 slices shown]
[im 19/55  brain]
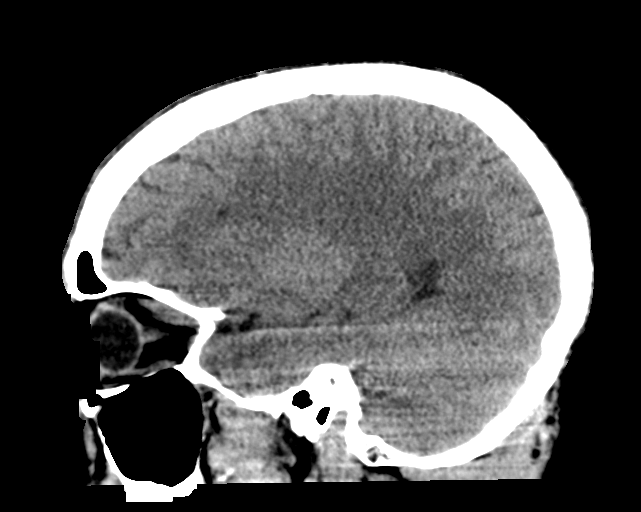
[im 28/55  brain]
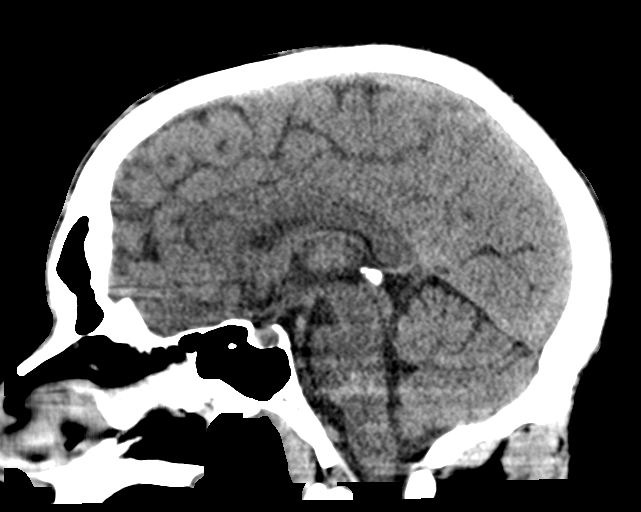
[im 37/55  brain]
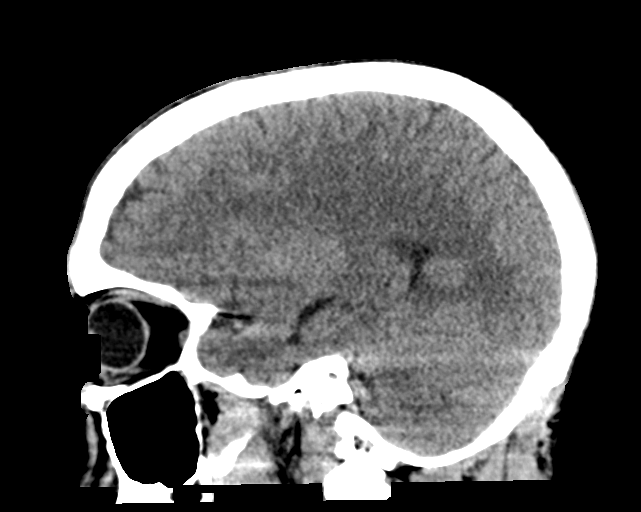

[16 of 47 positions shown; findings below may reference images not displayed]

FINDINGS: Brain: No evidence of acute territorial infarction, hemorrhage,
hydrocephalus,extra-axial collection or mass lesion/mass effect.
Normal gray-white differentiation. Ventricles are normal in size and
contour.

Vascular: No hyperdense vessel or unexpected calcification.

Skull: The skull is intact. No fracture or focal lesion identified.

Sinuses/Orbits: The visualized paranasal sinuses and mastoid air
cells are clear. The orbits and globes intact.

Other: Small soft tissue hematoma seen overlying the right occipital
skull.
IMPRESSION: No acute intracranial pathology.

Small soft tissue hematoma overlying the right posterior skull.
